# Patient Record
Sex: Female | Born: 1950 | Race: White | Hispanic: No | Marital: Single | Smoking: Never smoker
Health system: Southern US, Community
[De-identification: ages and names within clinical notes are randomized; demographics above are authoritative.]

## PROBLEM LIST (undated history)

## (undated) DIAGNOSIS — I10 Essential (primary) hypertension: Secondary | ICD-10-CM

## (undated) DIAGNOSIS — E785 Hyperlipidemia, unspecified: Secondary | ICD-10-CM

## (undated) DIAGNOSIS — M109 Gout, unspecified: Secondary | ICD-10-CM

## (undated) HISTORY — DX: Hyperlipidemia, unspecified: E78.5

## (undated) HISTORY — DX: Essential (primary) hypertension: I10

## (undated) HISTORY — DX: Gout, unspecified: M10.9

## (undated) HISTORY — PX: TONSILLECTOMY: SUR1361

---

## 2002-11-11 ENCOUNTER — Other Ambulatory Visit: Admission: RE | Admit: 2002-11-11 | Discharge: 2002-11-11 | Payer: Self-pay | Admitting: Family Medicine

## 2003-01-14 ENCOUNTER — Ambulatory Visit (HOSPITAL_COMMUNITY): Admission: RE | Admit: 2003-01-14 | Discharge: 2003-01-14 | Payer: Self-pay | Admitting: Gastroenterology

## 2003-01-14 ENCOUNTER — Encounter (INDEPENDENT_AMBULATORY_CARE_PROVIDER_SITE_OTHER): Payer: Self-pay

## 2003-03-31 ENCOUNTER — Encounter: Admission: RE | Admit: 2003-03-31 | Discharge: 2003-03-31 | Payer: Self-pay | Admitting: Gastroenterology

## 2003-04-08 ENCOUNTER — Encounter: Admission: RE | Admit: 2003-04-08 | Discharge: 2003-04-08 | Payer: Self-pay | Admitting: Gastroenterology

## 2004-09-05 ENCOUNTER — Other Ambulatory Visit: Admission: RE | Admit: 2004-09-05 | Discharge: 2004-09-05 | Payer: Self-pay | Admitting: Family Medicine

## 2009-11-03 ENCOUNTER — Other Ambulatory Visit: Admission: RE | Admit: 2009-11-03 | Discharge: 2009-11-03 | Payer: Self-pay | Admitting: Family Medicine

## 2010-06-24 NOTE — Op Note (Signed)
NAMEISMAHAN, Mary Whitney                         ACCOUNT NO.:  [id number]   MEDICAL RECORD NO.:  [id number]                   PATIENT TYPE:  AMB   LOCATION:  ENDO                                 FACILITY:  [facility]   PHYSICIAN:  John C. Madilyn Fireman, M.D.                 DATE OF BIRTH:  [birth year]   DATE OF PROCEDURE:  01/14/2003  DATE OF DISCHARGE:                                 OPERATIVE REPORT   PROCEDURE:  Colonoscopy with polypectomy.   INDICATION FOR PROCEDURE:  Initial average-risk colon screening in a 60-year-  old patient.   DESCRIPTION OF PROCEDURE:  The patient was placed in the left lateral  decubitus position and placed on the pulse monitor with continuous low-flow  oxygen delivered by nasal cannula.  She was sedated with 100 mcg IV fentanyl  and 10 mg IV Versed.  The Olympus video colonoscope was inserted into the  rectum and advanced to the cecum, confirmed by transillumination of  McBurney's point and visualization of the ileocecal valve and appendiceal  orifice.  The prep was excellent.  The cecum, ascending, transverse colon  appeared normal with no masses, polyps, diverticula, or other mucosal  abnormalities.  Within the descending colon there was seen a 1.25, 0.8 cm  polyp, which was fulgurated by snare.  The remainder of the descending,  sigmoid, and rectum appeared normal with no further polyps or masses.  There  were a very few scattered diverticula seen in the sigmoid colon.  The rectum  appeared normal, and retroflexed view of the anus revealed no obvious  internal hemorrhoids.  The scope was withdrawn and the patient returned to  the recovery room in stable condition.  She tolerated the procedure well,  and there were no immediate complications.   IMPRESSION:  1. Descending colon polyp.  2. Rare left-sided diverticula.   PLAN:  Await histology to determine method and interval for future colon  screening.                                               John C.  Madilyn Fireman, M.D.    [facility]  D:  01/14/2003  T:  01/14/2003  Job:  [id number]   cc:   Dario Guardian, M.D.  [address]  [place]  [place] [address]  Fax: [phone]

## 2014-04-22 ENCOUNTER — Other Ambulatory Visit (HOSPITAL_COMMUNITY)
Admission: RE | Admit: 2014-04-22 | Discharge: 2014-04-22 | Disposition: A | Discharge status: Home / Self Care | Payer: BLUE CROSS/BLUE SHIELD | Source: Ambulatory Visit | Attending: Family Medicine | Admitting: Family Medicine

## 2014-04-22 ENCOUNTER — Other Ambulatory Visit: Payer: Self-pay | Admitting: Family Medicine

## 2014-04-22 DIAGNOSIS — Z01419 Encounter for gynecological examination (general) (routine) without abnormal findings: Secondary | ICD-10-CM | POA: Diagnosis present

## 2014-04-24 LAB — CYTOLOGY - PAP

## 2016-01-06 ENCOUNTER — Ambulatory Visit
Admission: RE | Admit: 2016-01-06 | Discharge: 2016-01-06 | Disposition: A | Discharge status: Home / Self Care | Payer: BLUE CROSS/BLUE SHIELD | Source: Ambulatory Visit | Attending: Family Medicine | Admitting: Family Medicine

## 2016-01-06 ENCOUNTER — Other Ambulatory Visit: Payer: Self-pay | Admitting: Family Medicine

## 2016-01-06 DIAGNOSIS — S8001XA Contusion of right knee, initial encounter: Secondary | ICD-10-CM

## 2016-01-10 ENCOUNTER — Other Ambulatory Visit: Payer: Self-pay | Admitting: Family Medicine

## 2016-01-10 DIAGNOSIS — R9389 Abnormal findings on diagnostic imaging of other specified body structures: Secondary | ICD-10-CM

## 2016-01-12 ENCOUNTER — Ambulatory Visit
Admission: RE | Admit: 2016-01-12 | Discharge: 2016-01-12 | Disposition: A | Discharge status: Home / Self Care | Payer: BLUE CROSS/BLUE SHIELD | Source: Ambulatory Visit | Attending: Family Medicine | Admitting: Family Medicine

## 2016-01-12 DIAGNOSIS — R9389 Abnormal findings on diagnostic imaging of other specified body structures: Secondary | ICD-10-CM

## 2017-03-26 ENCOUNTER — Other Ambulatory Visit: Payer: Self-pay | Admitting: Family Medicine

## 2017-03-26 ENCOUNTER — Other Ambulatory Visit (HOSPITAL_COMMUNITY)
Admission: RE | Admit: 2017-03-26 | Discharge: 2017-03-26 | Disposition: A | Discharge status: Home / Self Care | Payer: BLUE CROSS/BLUE SHIELD | Source: Ambulatory Visit | Attending: Family Medicine | Admitting: Family Medicine

## 2017-03-26 DIAGNOSIS — Z124 Encounter for screening for malignant neoplasm of cervix: Secondary | ICD-10-CM | POA: Diagnosis not present

## 2017-03-27 LAB — CYTOLOGY - PAP: Diagnosis: NEGATIVE

## 2017-06-05 IMAGING — CT CT KNEE*R* W/O CM
1 series · 12 of 14 positions shown, 15 images · non-contrast
Comparison: Radiographs dated 01/06/2016

CLINICAL DATA: Right knee pain since a fall on 12/29/2015 at the
grocery store. Patella fracture.

EXAM:
CT OF THE RIGHT KNEE WITHOUT CONTRAST
TECHNIQUE: Multidetector CT imaging of the right knee was performed according
to the standard protocol. Multiplanar CT image reconstructions were
also generated.

[Series 4: knee soft tissue · axial · 0.28mm/px · z∈[+277,+427]mm · 12 of 60 slices shown, 15 images]
[im 5/60  soft-tissue]
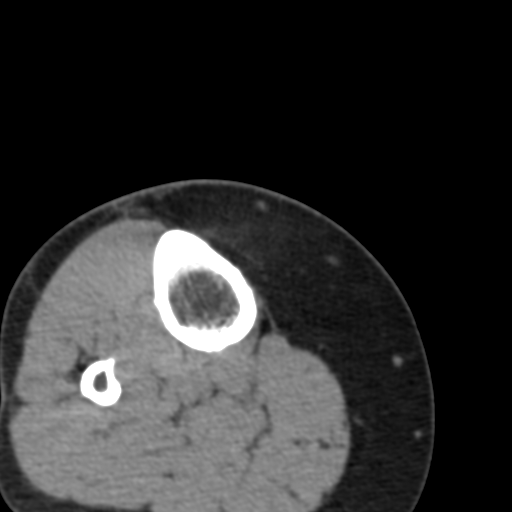
[im 5/60  bone]
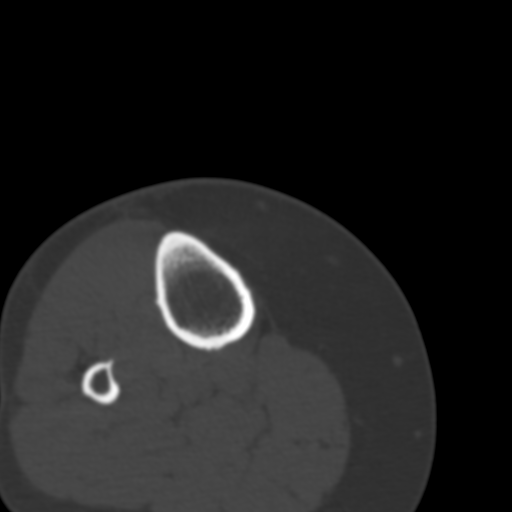
[im 10/60  bone]
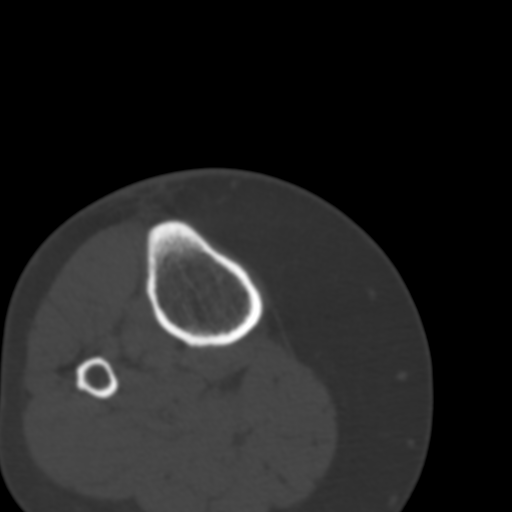
[im 14/60  bone]
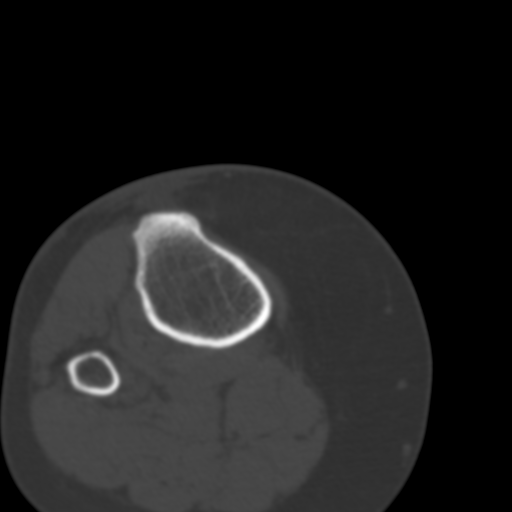
[im 19/60  bone]
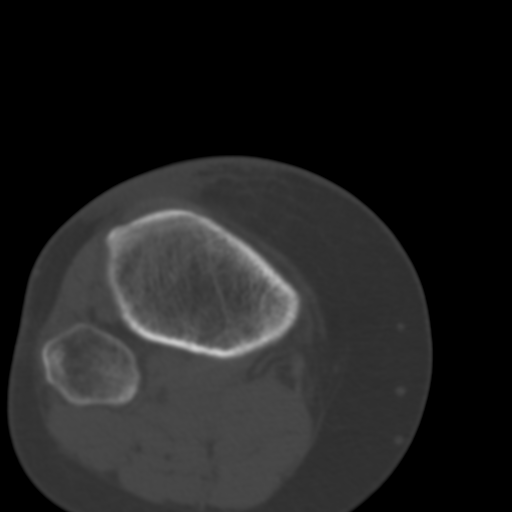
[im 23/60  soft-tissue]
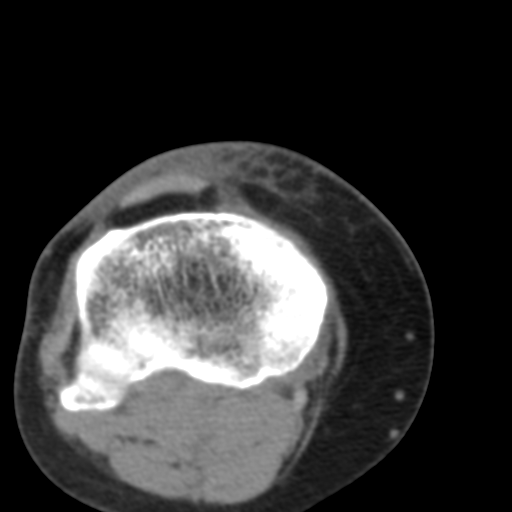
[im 23/60  bone]
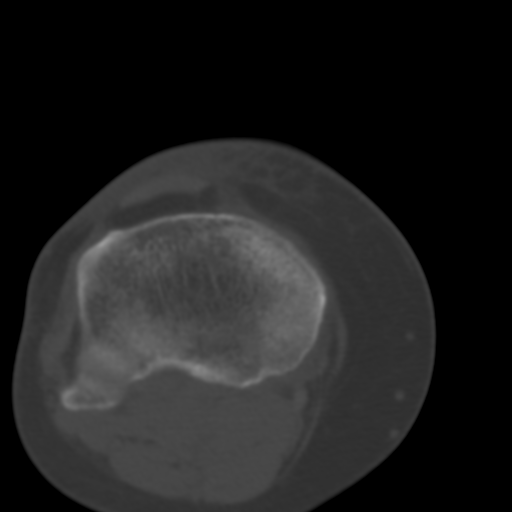
[im 28/60  bone]
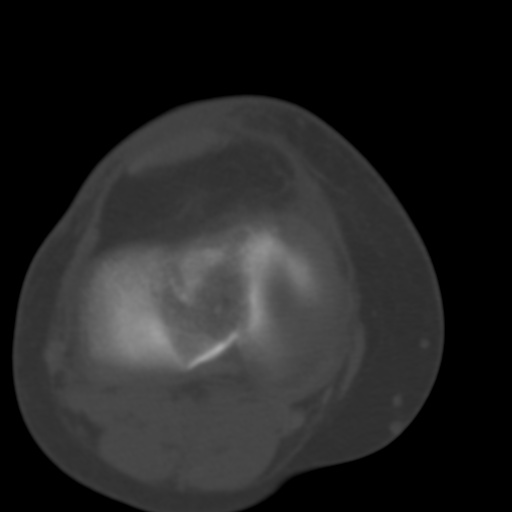
[im 32/60  bone]
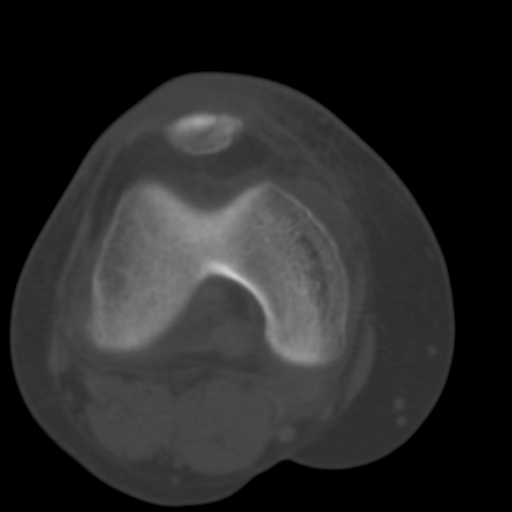
[im 37/60  bone]
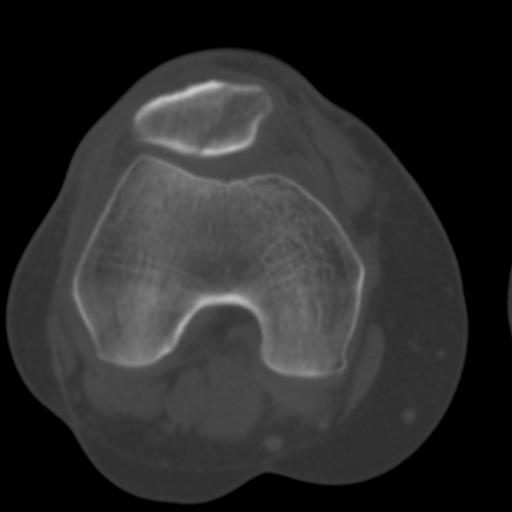
[im 41/60  soft-tissue]
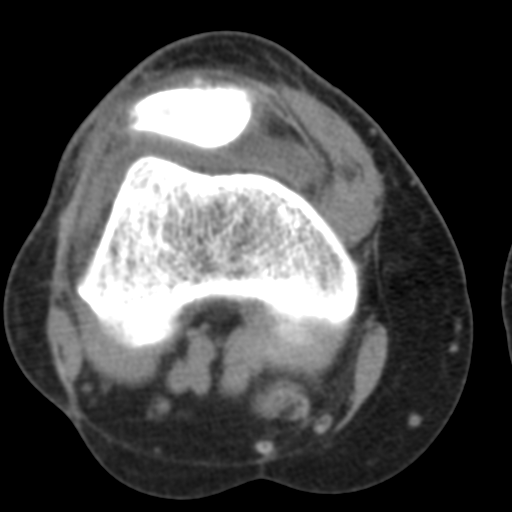
[im 41/60  bone]
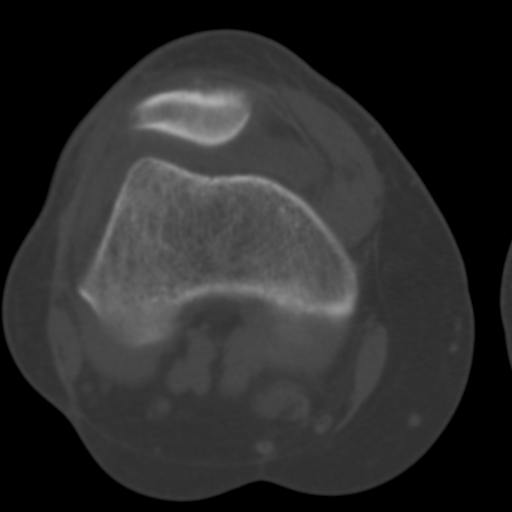
[im 46/60  bone]
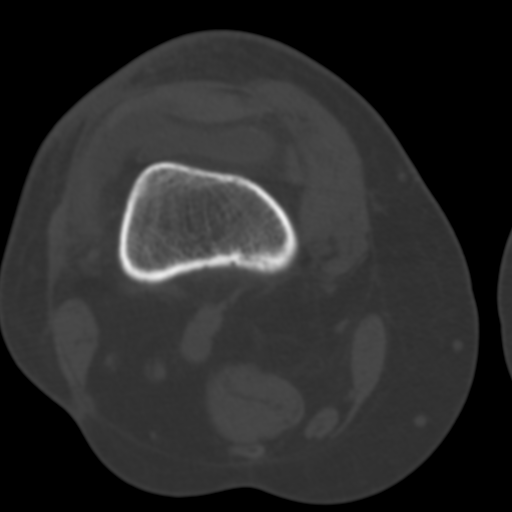
[im 50/60  bone]
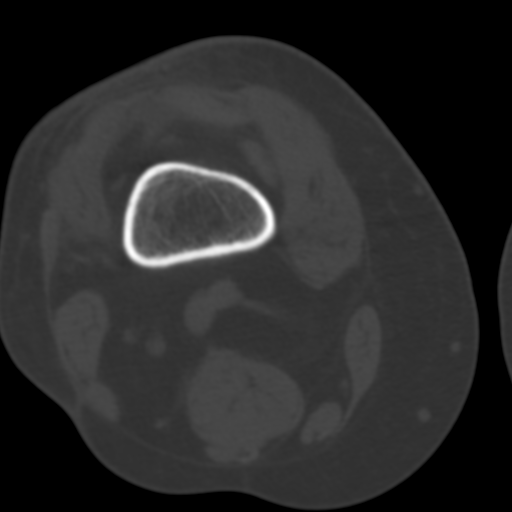
[im 55/60  bone]
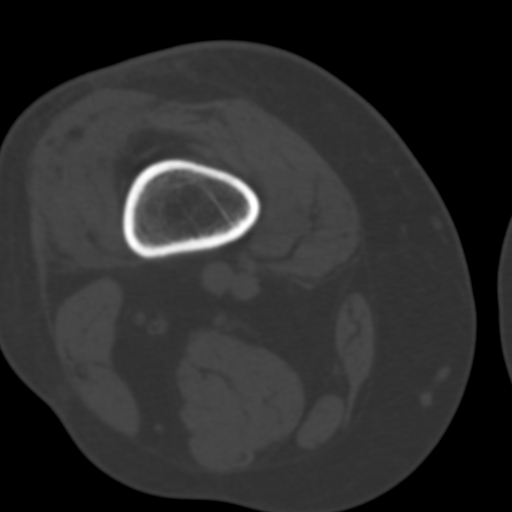

[12 of 14 positions shown; findings below may reference images not displayed]

FINDINGS: Bones/Joint/Cartilage

There is a comminuted minimally distracted fracture of the mid and
lower pole of the patella. There is a small underlying joint
effusion. There is slight medial joint space narrowing.

Ligaments

Suboptimally assessed by CT. Cruciate and collateral ligaments are
intact.

Muscles and Tendons

Normal.

Soft tissues

Edema in the soft tissues anterior to the patellar tendon and
patella secondary to the adjacent patella fracture.
IMPRESSION: Comminuted transverse minimally distracted fracture of the mid and
lower portion of the patella.

## 2019-11-10 NOTE — Progress Notes (Signed)
Cardiology Office Note:   Date:  11/13/2019  NAME:  Mary Whitney    MRN: [id number] DOB:  [birth year]   PCP:  Mary Brunette, MD  Cardiologist:  No primary care provider on file.   Referring MD: Mary Brunette, MD   Chief Complaint  Patient presents with  . Hyperlipidemia   History of Present Illness:   Mary Whitney is a 69 y.o. female with a hx of HLD who is being seen today for the evaluation of HLD at the request of Mary Brunette, MD. Calcium score recommended.  She has been evaluated by her primary care physician for arthritis type symptoms.  Apparently she had a CRP checked that was elevated.  On review labs showed CRP was 5.3.  She was started on a statin and her CRP on repeat was 6.7.  She was then recommended for calcium scoring due to family history of heart disease.  She reports that her father had a history of heart disease.  She is never had a heart attack or stroke.  Her most recent lipid level from her primary care physician demonstrates a total cholesterol 131, HDL 52, LDL 65, triglycerides 70.  A1c 6.3.  She reports that she has no symptoms of chest pain or shortness of breath.  She does walk 3 times a week.  She can walk 2 to 3 miles without any major limitations.  She describes no chest pain, shortness of breath or palpitations.  She is overweight with a BMI of 32.  She reports she is working on her diet more.  She is going to try to exercise more regularly as well.  Her EKG today demonstrates normal sinus rhythm heart rate 92 with no acute ST-T changes or evidence of prior infarction.  Father apparently had heart failure.  No other siblings have heart disease.  We did discuss calcium scoring and she is interested.  She will continue her Crestor 10 mg daily.  Blood pressures well controlled on lisinopril-HCTZ.  Past Medical History: Past Medical History:  Diagnosis Date  . Gout   . Hyperlipidemia   . Hypertension     Past Surgical History: Past Surgical History:   Procedure Laterality Date  . TONSILLECTOMY     Current Medications: Current Meds  Medication Sig  . allopurinol (ZYLOPRIM) 300 MG tablet Take 300 mg by mouth daily.  Marland Kitchen lisinopril-hydrochlorothiazide (ZESTORETIC) 10-12.5 MG tablet Take 1 tablet by mouth daily.  . rosuvastatin (CRESTOR) 10 MG tablet Take 10 mg by mouth daily.    Allergies:    Patient has no allergy information on record.   Social History: Social History   Socioeconomic History  . Marital status: Single    Spouse name: Not on file  . Number of children: Not on file  . Years of education: Not on file  . Highest education level: Not on file  Occupational History  . Occupation: regulatory affairs  Tobacco Use  . Smoking status: Never Smoker  . Smokeless tobacco: Never Used  Substance and Sexual Activity  . Alcohol use: Not Currently  . Drug use: Never  . Sexual activity: Not Currently  Other Topics Concern  . Not on file  Social History Narrative  . Not on file   Social Determinants of Health   Financial Resource Strain:   . Difficulty of Paying Living Expenses: Not on file  Food Insecurity:   . Worried About Programme researcher, broadcasting/film/video in the Last Year: Not on file  . Ran Out of Food  in the Last Year: Not on file  Transportation Needs:   . Lack of Transportation (Medical): Not on file  . Lack of Transportation (Non-Medical): Not on file  Physical Activity:   . Days of Exercise per Week: Not on file  . Minutes of Exercise per Session: Not on file  Stress:   . Feeling of Stress : Not on file  Social Connections:   . Frequency of Communication with Friends and Family: Not on file  . Frequency of Social Gatherings with Friends and Family: Not on file  . Attends Religious Services: Not on file  . Active Member of Clubs or Organizations: Not on file  . Attends Banker Meetings: Not on file  . Marital Status: Not on file    Family History: The patient's family history includes Cancer in her  mother; Heart disease in her father; Heart failure in her father.  ROS:   All other ROS reviewed and negative. Pertinent positives noted in the HPI.     EKGs/Labs/Other Studies Reviewed:   The following studies were personally reviewed by me today:  EKG:  EKG is ordered today.  The ekg ordered today demonstrates normal sinus rhythm, heart rate 92, no acute ischemic changes, no evidence of prior infarction, and was personally reviewed by me.   Recent Labs: No results found for requested labs within last 8760 hours.   Recent Lipid Panel No results found for: CHOL, TRIG, HDL, CHOLHDL, VLDL, LDLCALC, LDLDIRECT  Physical Exam:   VS:  BP 120/70   Pulse 92   Temp (!) 96.4 F (35.8 C)   Ht 5\' 1"  (1.549 m)   Wt 173 lb 6.4 oz (78.7 kg)   SpO2 96%   BMI 32.76 kg/m    Wt Readings from Last 3 Encounters:  11/13/19 173 lb 6.4 oz (78.7 kg)    General: Well nourished, well developed, in no acute distress Heart: Atraumatic, normal size  Eyes: PEERLA, EOMI  Neck: Supple, no JVD Endocrine: No thryomegaly Cardiac: Normal S1, S2; RRR; no murmurs, rubs, or gallops Lungs: Clear to auscultation bilaterally, no wheezing, rhonchi or rales  Abd: Soft, nontender, no hepatomegaly  Ext: No edema, pulses 2+ Musculoskeletal: No deformities, BUE and BLE strength normal and equal Skin: Warm and dry, no rashes   Neuro: Alert and oriented to person, place, time, and situation, CNII-XII grossly intact, no focal deficits  Psych: Normal mood and affect   ASSESSMENT:   Mary Whitney is a 69 y.o. female who presents for the following: 1. Family history of heart disease   2. Mixed hyperlipidemia   3. Obesity (BMI 30-39.9)   4. Primary hypertension     PLAN:   1. Family history of heart disease -Family history of heart disease.  Elevated CRP.  Most recent LDL cholesterol 65.  We will proceed with coronary calcium scoring for further risk ratification.  Her LDL cholesterol is 65.  Even if we find  considerable plaque she should continue her dose of Crestor.  She may need a stress test if her coronary calcium score significantly elevated.  I will follow-up the results of this with her by phone.  2. Mixed hyperlipidemia -Most recent LDL cholesterol 65.  Continue Crestor.  3. Obesity (BMI 30-39.9) -Diet and exercise recommended.  4. Primary hypertension -Well-controlled on current medications.  Disposition: Return if symptoms worsen or fail to improve.  Medication Adjustments/Labs and Tests Ordered: Current medicines are reviewed at length with the patient today.  Concerns regarding medicines  are outlined above.  No orders of the defined types were placed in this encounter.  No orders of the defined types were placed in this encounter.   Patient Instructions  Medication Instructions:  The current medical regimen is effective;  continue present plan and medications.  *If you need a refill on your cardiac medications before your next appointment, please call your pharmacy*  Testing/Procedures: CALCIUM SCORE    Follow-Up: At [facility], you and your health needs are our priority.  As part of our continuing mission to provide you with exceptional heart care, we have created designated Provider Care Teams.  These Care Teams include your primary Cardiologist (physician) and Advanced Practice Providers (APPs -  Physician Assistants and Nurse Practitioners) who all work together to provide you with the care you need, when you need it.  We recommend signing up for the patient portal called "MyChart".  Sign up information is provided on this After Visit Summary.  MyChart is used to connect with patients for Virtual Visits (Telemedicine).  Patients are able to view lab/test results, encounter notes, upcoming appointments, etc.  Non-urgent messages can be sent to your provider as well.   To learn more about what you can do with MyChart, go to [email or web address].    Your next  appointment:   As needed  The format for your next appointment:   In Person  Provider:   Lennie Odor, MD      Signed, Lenna Gilford. Flora Lipps, MD [facility]  [address] [place], [place] [address] [phone]  11/13/2019 9:14 AM

## 2019-11-13 ENCOUNTER — Other Ambulatory Visit: Payer: Self-pay

## 2019-11-13 ENCOUNTER — Ambulatory Visit: Payer: BC Managed Care – PPO | Admitting: Cardiovascular Disease

## 2019-11-13 ENCOUNTER — Encounter: Payer: Self-pay | Admitting: Cardiovascular Disease

## 2019-11-13 VITALS — BP 120/70 | HR 92 | Temp 96.4°F | Ht 61.0 in | Wt 173.4 lb

## 2019-11-13 DIAGNOSIS — E669 Obesity, unspecified: Secondary | ICD-10-CM

## 2019-11-13 DIAGNOSIS — I10 Essential (primary) hypertension: Secondary | ICD-10-CM

## 2019-11-13 DIAGNOSIS — E782 Mixed hyperlipidemia: Secondary | ICD-10-CM | POA: Diagnosis not present

## 2019-11-13 DIAGNOSIS — Z8249 Family history of ischemic heart disease and other diseases of the circulatory system: Secondary | ICD-10-CM | POA: Diagnosis not present

## 2019-11-13 NOTE — Patient Instructions (Signed)
Medication Instructions:  The current medical regimen is effective;  continue present plan and medications.  *If you need a refill on your cardiac medications before your next appointment, please call your pharmacy*   Testing/Procedures: CALCIUM SCORE   Follow-Up: At [facility], you and your health needs are our priority.  As part of our continuing mission to provide you with exceptional heart care, we have created designated Provider Care Teams.  These Care Teams include your primary Cardiologist (physician) and Advanced Practice Providers (APPs -  Physician Assistants and Nurse Practitioners) who all work together to provide you with the care you need, when you need it.  We recommend signing up for the patient portal called "MyChart".  Sign up information is provided on this After Visit Summary.  MyChart is used to connect with patients for Virtual Visits (Telemedicine).  Patients are able to view lab/test results, encounter notes, upcoming appointments, etc.  Non-urgent messages can be sent to your provider as well.   To learn more about what you can do with MyChart, go to [email or web address].    Your next appointment:   As needed  The format for your next appointment:   In Person  Provider:   Philmont O'Neal, MD     

## 2019-11-26 ENCOUNTER — Other Ambulatory Visit: Payer: Self-pay

## 2019-11-26 ENCOUNTER — Ambulatory Visit (INDEPENDENT_AMBULATORY_CARE_PROVIDER_SITE_OTHER)
Admission: RE | Admit: 2019-11-26 | Discharge: 2019-11-26 | Disposition: A | Discharge status: Home / Self Care | Payer: Self-pay | Source: Ambulatory Visit | Attending: Cardiovascular Disease | Admitting: Cardiovascular Disease

## 2019-11-26 DIAGNOSIS — Z8249 Family history of ischemic heart disease and other diseases of the circulatory system: Secondary | ICD-10-CM
